# Patient Record
Sex: Female | Born: 1990 | Race: Black or African American | Hispanic: No | Marital: Single | Smoking: Former smoker
Health system: Southern US, Community
[De-identification: ages and names within clinical notes are randomized; demographics above are authoritative.]

## PROBLEM LIST (undated history)

## (undated) DIAGNOSIS — E282 Polycystic ovarian syndrome: Secondary | ICD-10-CM

## (undated) DIAGNOSIS — I10 Essential (primary) hypertension: Secondary | ICD-10-CM

## (undated) DIAGNOSIS — E119 Type 2 diabetes mellitus without complications: Secondary | ICD-10-CM

## (undated) DIAGNOSIS — T7840XA Allergy, unspecified, initial encounter: Secondary | ICD-10-CM

## (undated) HISTORY — PX: CORNEAL TRANSPLANT: SHX108

## (undated) HISTORY — DX: Allergy, unspecified, initial encounter: T78.40XA

## (undated) HISTORY — DX: Essential (primary) hypertension: I10

## (undated) HISTORY — DX: Type 2 diabetes mellitus without complications: E11.9

## (undated) HISTORY — DX: Polycystic ovarian syndrome: E28.2

---

## 2017-05-12 DIAGNOSIS — Z947 Corneal transplant status: Secondary | ICD-10-CM | POA: Diagnosis not present

## 2017-06-08 DIAGNOSIS — E119 Type 2 diabetes mellitus without complications: Secondary | ICD-10-CM | POA: Diagnosis not present

## 2017-06-16 DIAGNOSIS — R1011 Right upper quadrant pain: Secondary | ICD-10-CM | POA: Diagnosis not present

## 2017-06-16 DIAGNOSIS — I10 Essential (primary) hypertension: Secondary | ICD-10-CM | POA: Diagnosis not present

## 2017-06-16 DIAGNOSIS — E119 Type 2 diabetes mellitus without complications: Secondary | ICD-10-CM | POA: Diagnosis not present

## 2017-06-16 DIAGNOSIS — E282 Polycystic ovarian syndrome: Secondary | ICD-10-CM | POA: Diagnosis not present

## 2017-06-27 DIAGNOSIS — K5900 Constipation, unspecified: Secondary | ICD-10-CM | POA: Diagnosis not present

## 2017-06-27 DIAGNOSIS — R1011 Right upper quadrant pain: Secondary | ICD-10-CM | POA: Diagnosis not present

## 2017-07-27 DIAGNOSIS — Z23 Encounter for immunization: Secondary | ICD-10-CM | POA: Diagnosis not present

## 2017-07-28 DIAGNOSIS — N921 Excessive and frequent menstruation with irregular cycle: Secondary | ICD-10-CM | POA: Diagnosis not present

## 2017-08-04 DIAGNOSIS — N840 Polyp of corpus uteri: Secondary | ICD-10-CM | POA: Diagnosis not present

## 2017-08-04 DIAGNOSIS — Z3202 Encounter for pregnancy test, result negative: Secondary | ICD-10-CM | POA: Diagnosis not present

## 2017-08-04 DIAGNOSIS — N939 Abnormal uterine and vaginal bleeding, unspecified: Secondary | ICD-10-CM | POA: Diagnosis not present

## 2017-08-15 DIAGNOSIS — Z947 Corneal transplant status: Secondary | ICD-10-CM | POA: Diagnosis not present

## 2017-08-19 DIAGNOSIS — Z30430 Encounter for insertion of intrauterine contraceptive device: Secondary | ICD-10-CM | POA: Diagnosis not present

## 2017-08-19 DIAGNOSIS — Z3202 Encounter for pregnancy test, result negative: Secondary | ICD-10-CM | POA: Diagnosis not present

## 2017-11-10 DIAGNOSIS — Z947 Corneal transplant status: Secondary | ICD-10-CM | POA: Diagnosis not present

## 2017-12-21 DIAGNOSIS — H4720 Unspecified optic atrophy: Secondary | ICD-10-CM | POA: Diagnosis not present

## 2017-12-21 DIAGNOSIS — Z0100 Encounter for examination of eyes and vision without abnormal findings: Secondary | ICD-10-CM | POA: Diagnosis not present

## 2017-12-21 DIAGNOSIS — E119 Type 2 diabetes mellitus without complications: Secondary | ICD-10-CM | POA: Diagnosis not present

## 2017-12-21 DIAGNOSIS — Z947 Corneal transplant status: Secondary | ICD-10-CM | POA: Diagnosis not present

## 2018-03-02 DIAGNOSIS — Z947 Corneal transplant status: Secondary | ICD-10-CM | POA: Diagnosis not present

## 2018-03-02 DIAGNOSIS — H25013 Cortical age-related cataract, bilateral: Secondary | ICD-10-CM | POA: Diagnosis not present

## 2018-04-12 DIAGNOSIS — H1789 Other corneal scars and opacities: Secondary | ICD-10-CM | POA: Diagnosis not present

## 2018-04-12 DIAGNOSIS — Z947 Corneal transplant status: Secondary | ICD-10-CM | POA: Diagnosis not present

## 2018-04-12 DIAGNOSIS — H04211 Epiphora due to excess lacrimation, right lacrimal gland: Secondary | ICD-10-CM | POA: Diagnosis not present

## 2018-04-12 DIAGNOSIS — H40051 Ocular hypertension, right eye: Secondary | ICD-10-CM | POA: Diagnosis not present

## 2018-04-25 DIAGNOSIS — H3589 Other specified retinal disorders: Secondary | ICD-10-CM | POA: Diagnosis not present

## 2018-04-25 DIAGNOSIS — H53413 Scotoma involving central area, bilateral: Secondary | ICD-10-CM | POA: Diagnosis not present

## 2018-04-25 DIAGNOSIS — Z7984 Long term (current) use of oral hypoglycemic drugs: Secondary | ICD-10-CM | POA: Diagnosis not present

## 2018-04-25 DIAGNOSIS — H47393 Other disorders of optic disc, bilateral: Secondary | ICD-10-CM | POA: Diagnosis not present

## 2018-04-25 DIAGNOSIS — H4720 Unspecified optic atrophy: Secondary | ICD-10-CM | POA: Diagnosis not present

## 2018-05-05 DIAGNOSIS — H4720 Unspecified optic atrophy: Secondary | ICD-10-CM | POA: Diagnosis not present

## 2018-05-19 DIAGNOSIS — T86840 Corneal transplant rejection: Secondary | ICD-10-CM | POA: Diagnosis not present

## 2018-05-19 DIAGNOSIS — H40051 Ocular hypertension, right eye: Secondary | ICD-10-CM | POA: Diagnosis not present

## 2018-05-19 DIAGNOSIS — Z947 Corneal transplant status: Secondary | ICD-10-CM | POA: Diagnosis not present

## 2018-06-21 DIAGNOSIS — H4720 Unspecified optic atrophy: Secondary | ICD-10-CM | POA: Diagnosis not present

## 2018-06-21 DIAGNOSIS — T86840 Corneal transplant rejection: Secondary | ICD-10-CM | POA: Diagnosis not present

## 2018-06-21 DIAGNOSIS — H1789 Other corneal scars and opacities: Secondary | ICD-10-CM | POA: Diagnosis not present

## 2018-06-21 DIAGNOSIS — H40051 Ocular hypertension, right eye: Secondary | ICD-10-CM | POA: Diagnosis not present

## 2018-07-21 ENCOUNTER — Ambulatory Visit (HOSPITAL_COMMUNITY)
Admission: EM | Admit: 2018-07-21 | Discharge: 2018-07-21 | Disposition: A | Discharge status: Home / Self Care | Payer: BC Managed Care – PPO | Attending: Internal Medicine | Admitting: Internal Medicine

## 2018-07-21 ENCOUNTER — Ambulatory Visit (INDEPENDENT_AMBULATORY_CARE_PROVIDER_SITE_OTHER): Payer: BC Managed Care – PPO

## 2018-07-21 ENCOUNTER — Encounter (HOSPITAL_COMMUNITY): Payer: Self-pay

## 2018-07-21 ENCOUNTER — Other Ambulatory Visit: Payer: Self-pay

## 2018-07-21 DIAGNOSIS — M25511 Pain in right shoulder: Secondary | ICD-10-CM | POA: Diagnosis not present

## 2018-07-21 MED ORDER — NAPROXEN 375 MG PO TABS: 375.0000 mg | ORAL_TABLET | Freq: Two times a day (BID) | ORAL | 0 refills | Status: DC

## 2018-07-21 NOTE — ED Provider Notes (Signed)
[place]    CSN: [id number] Arrival date & time: 07/21/18  1104     History   Chief Complaint Chief Complaint  Patient presents with  . Appointment    11:10  . Shoulder Pain    HPI Tamara Boyer is a 28 y.o. female comes to urgent care with right shoulder pain.  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  Since then the patient has continued to have pain around the right shoulder.  Pain is of moderate severity.  Worsened with palpation or movement around the right shoulder.  She denies any relieving factors.  She is tried Tylenol with no relief.  No swelling, bruising.   HPI  History reviewed. No pertinent past medical history.  There are no active problems to display for this patient.   History reviewed. No pertinent surgical history.  OB History   No obstetric history on file.      Home Medications    Prior to Admission medications   Medication Sig Start Date End Date Taking? Authorizing Provider  naproxen (NAPROSYN) 375 MG tablet Take 1 tablet (375 mg total) by mouth 2 (two) times daily. 07/21/18   Tykera Skates, Myrene Galas, MD    Family History Family History  Problem Relation Age of Onset  . Diabetes Mother   . Cancer Father   . Diabetes Father   . Kidney disease Father   . Hypertension Father     Social History Social History   Tobacco Use  . Smoking status: Current Every Day Smoker    Types: Cigarettes  . Smokeless tobacco: Never Used  Substance Use Topics  . Alcohol use: Yes    Comment: occasionally  . Drug use: Never     Allergies   Patient has no known allergies.   Review of Systems Review of Systems  Constitutional: Negative.   HENT: Negative.   Respiratory: Negative.   Gastrointestinal: Negative.   Genitourinary: Negative for dysuria, frequency and urgency.  Musculoskeletal: Positive for arthralgias and myalgias. Negative for back pain, gait problem, joint swelling,  neck pain and neck stiffness.  Skin: Negative.   Neurological: Negative for dizziness, weakness, numbness and headaches.  Psychiatric/Behavioral: Negative.      Physical Exam Triage Vital Signs ED Triage Vitals  Enc Vitals Group     BP 07/21/18 1141 (!) 159/96     Pulse Rate 07/21/18 1141 65     Resp 07/21/18 1141 16     Temp 07/21/18 1141 97.7 F (36.5 C)     Temp Source 07/21/18 1141 Oral     SpO2 07/21/18 1141 100 %     Weight --      Height --      Head Circumference --      Peak Flow --      Pain Score 07/21/18 1139 6     Pain Loc --      Pain Edu? --      Excl. in [place]? --    No data found.  Updated Vital Signs BP (!) 159/96 (BP Location: Left Arm)   Pulse 65   Temp 97.7 F (36.5 C) (Oral)   Resp 16   SpO2 100%   Visual Acuity Right Eye Distance:   Left Eye Distance:   Bilateral Distance:    Right Eye Near:   Left Eye Near:    Bilateral Near:     Physical Exam Constitutional:  General: She is in acute distress.     Appearance: She is not toxic-appearing.  Neck:     Musculoskeletal: Normal range of motion and neck supple. Muscular tenderness present. No neck rigidity.  Cardiovascular:     Rate and Rhythm: Normal rate and regular rhythm.  Pulmonary:     Effort: Pulmonary effort is normal.     Breath sounds: Normal breath sounds.  Abdominal:     General: Bowel sounds are normal.     Palpations: Abdomen is soft.  Musculoskeletal:     Comments: Full range of motion around the right shoulder with pain.  Power is 5/5 in the upper extremity.  No sensory deficit.  Lymphadenopathy:     Cervical: No cervical adenopathy.  Skin:    General: Skin is warm.     Capillary Refill: Capillary refill takes less than 2 seconds.     Coloration: Skin is not jaundiced.     Findings: No bruising or lesion.  Neurological:     General: No focal deficit present.     Mental Status: She is alert and oriented to person, place, and time.      UC Treatments / Results   Labs (all labs ordered are listed, but only abnormal results are displayed) Labs Reviewed - No data to display  EKG   Radiology Dg Shoulder Right  Result Date: 07/21/2018 CLINICAL DATA:  Injury to right shoulder.  Right shoulder pain. EXAM: RIGHT SHOULDER - 2+ VIEW COMPARISON:  None. FINDINGS: Right shoulder is located without a fracture. Normal appearance of the right clavicle and right AC joint. Visualized right ribs are intact. IMPRESSION: Negative. Electronically Signed   By: Richarda OverlieAdam  Henn M.D.   On: 07/21/2018 12:30    Procedures Procedures (including critical care time)  Medications Ordered in UC Medications - No data to display  Initial Impression / Assessment and Plan / UC Course  I have reviewed the triage vital signs and the nursing notes.  Pertinent labs & imaging results that were available during my care of the patient were reviewed by me and considered in my medical decision making (see chart for details).     1.  Right shoulder pain: X-ray of the right shoulder is negative for acute fracture Naproxen twice daily for 5 days Gentle range of motion Continue icing the right shoulder If pain worsens patient can return to urgent care to be reevaluated Final Clinical Impressions(s) / UC Diagnoses   Final diagnoses:  Acute pain of right shoulder   Discharge Instructions   None    ED Prescriptions    Medication Sig Dispense Auth. Provider   naproxen (NAPROSYN) 375 MG tablet Take 1 tablet (375 mg total) by mouth 2 (two) times daily. 20 tablet Edmundo Tedesco, Britta MccreedyPhilip O, MD     Controlled Substance Prescriptions [place] Controlled Substance Registry consulted? No   Merrilee JanskyLamptey, Jernard Reiber O, MD 07/21/18 1315

## 2018-07-21 NOTE — ED Triage Notes (Signed)
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 Patient reportsthte pain has not gotten any better in the past few days, arm is painful with movement.

## 2018-08-18 DIAGNOSIS — H1789 Other corneal scars and opacities: Secondary | ICD-10-CM | POA: Diagnosis not present

## 2018-08-18 DIAGNOSIS — H40051 Ocular hypertension, right eye: Secondary | ICD-10-CM | POA: Diagnosis not present

## 2018-08-18 DIAGNOSIS — T86840 Corneal transplant rejection: Secondary | ICD-10-CM | POA: Diagnosis not present

## 2018-08-31 ENCOUNTER — Encounter: Payer: Self-pay | Admitting: Family Medicine

## 2018-08-31 ENCOUNTER — Ambulatory Visit (INDEPENDENT_AMBULATORY_CARE_PROVIDER_SITE_OTHER): Payer: BC Managed Care – PPO | Admitting: Family Medicine

## 2018-08-31 ENCOUNTER — Other Ambulatory Visit: Payer: Self-pay

## 2018-08-31 VITALS — BP 150/100 | HR 70 | Temp 97.9°F | Ht 63.0 in | Wt 253.2 lb

## 2018-08-31 DIAGNOSIS — Z2821 Immunization not carried out because of patient refusal: Secondary | ICD-10-CM

## 2018-08-31 DIAGNOSIS — E119 Type 2 diabetes mellitus without complications: Secondary | ICD-10-CM | POA: Diagnosis not present

## 2018-08-31 DIAGNOSIS — I10 Essential (primary) hypertension: Secondary | ICD-10-CM | POA: Diagnosis not present

## 2018-08-31 DIAGNOSIS — Z0000 Encounter for general adult medical examination without abnormal findings: Secondary | ICD-10-CM

## 2018-08-31 LAB — CBC
HCT: 38.4 % (ref 36.0–46.0)
Hemoglobin: 12.2 g/dL (ref 12.0–15.0)
MCHC: 31.9 g/dL (ref 30.0–36.0)
MCV: 74.1 fl — ABNORMAL LOW (ref 78.0–100.0)
Platelets: 267 10*3/uL (ref 150.0–400.0)
RBC: 5.19 Mil/uL — ABNORMAL HIGH (ref 3.87–5.11)
RDW: 15.9 % — ABNORMAL HIGH (ref 11.5–15.5)
WBC: 5.1 10*3/uL (ref 4.0–10.5)

## 2018-08-31 LAB — BASIC METABOLIC PANEL
BUN: 14 mg/dL (ref 6–23)
CO2: 25 mEq/L (ref 19–32)
Calcium: 9 mg/dL (ref 8.4–10.5)
Chloride: 104 mEq/L (ref 96–112)
Creatinine, Ser: 0.75 mg/dL (ref 0.40–1.20)
GFR: 109.37 mL/min (ref >60.00)
Glucose, Bld: 97 mg/dL (ref 70–99)
Potassium: 3.9 mEq/L (ref 3.5–5.1)
Sodium: 138 mEq/L (ref 135–145)

## 2018-08-31 LAB — LIPID PANEL
Cholesterol: 175 mg/dL (ref 0–200)
HDL: 42 mg/dL (ref >39.00)
LDL Cholesterol: 122 mg/dL — ABNORMAL HIGH (ref 0–99)
NonHDL: 132.71
Total CHOL/HDL Ratio: 4
Triglycerides: 54 mg/dL (ref 0.0–149.0)
VLDL: 10.8 mg/dL (ref 0.0–40.0)

## 2018-08-31 LAB — MICROALBUMIN / CREATININE URINE RATIO
Creatinine,U: 129.9 mg/dL
Microalb Creat Ratio: 1 mg/g (ref 0.0–30.0)
Microalb, Ur: 1.3 mg/dL (ref 0.0–1.9)

## 2018-08-31 LAB — AST: AST: 15 U/L (ref 0–37)

## 2018-08-31 LAB — HEMOGLOBIN A1C: Hgb A1c MFr Bld: 6.6 % — ABNORMAL HIGH (ref 4.6–6.5)

## 2018-08-31 LAB — VITAMIN D 25 HYDROXY (VIT D DEFICIENCY, FRACTURES): VITD: <7 ng/mL — ABNORMAL LOW (ref 30.00–100.00)

## 2018-08-31 LAB — ALT: ALT: 15 U/L (ref 0–35)

## 2018-08-31 MED ORDER — TRIAMTERENE-HCTZ 75-50 MG PO TABS: 1.0000 | ORAL_TABLET | Freq: Every day | ORAL | 3 refills | Status: DC

## 2018-08-31 MED ORDER — METFORMIN HCL ER 750 MG PO TB24: 750.0000 mg | ORAL_TABLET | Freq: Two times a day (BID) | ORAL | 3 refills | Status: DC

## 2018-08-31 NOTE — Patient Instructions (Signed)
Health Maintenance, Female Adopting a healthy lifestyle and getting preventive care are important in promoting health and wellness. Ask your health care provider about:  The right schedule for you to have regular tests and exams.  Things you can do on your own to prevent diseases and keep yourself healthy. What should I know about diet, weight, and exercise? Eat a healthy diet   Eat a diet that includes plenty of vegetables, fruits, low-fat dairy products, and lean protein.  Do not eat a lot of foods that are high in solid fats, added sugars, or sodium. Maintain a healthy weight Body mass index (BMI) is used to identify weight problems. It estimates body fat based on height and weight. Your health care provider can help determine your BMI and help you achieve or maintain a healthy weight. Get regular exercise Get regular exercise. This is one of the most important things you can do for your health. Most adults should:  Exercise for at least 150 minutes each week. The exercise should increase your heart rate and make you sweat (moderate-intensity exercise).  Do strengthening exercises at least twice a week. This is in addition to the moderate-intensity exercise.  Spend less time sitting. Even light physical activity can be beneficial. Watch cholesterol and blood lipids Have your blood tested for lipids and cholesterol at 30 years of age, then have this test every 5 years. Have your cholesterol levels checked more often if:  Your lipid or cholesterol levels are high.  You are older than 30 years of age.  You are at high risk for heart disease. What should I know about cancer screening? Depending on your health history and family history, you may need to have cancer screening at various ages. This may include screening for:  Breast cancer.  Cervical cancer.  Colorectal cancer.  Skin cancer.  Lung cancer. What should I know about heart disease, diabetes, and high blood  pressure? Blood pressure and heart disease  High blood pressure causes heart disease and increases the risk of stroke. This is more likely to develop in people who have high blood pressure readings, are of African descent, or are overweight.  Have your blood pressure checked: ? Every 3-5 years if you are 18-39 years of age. ? Every year if you are 40 years old or older. Diabetes Have regular diabetes screenings. This checks your fasting blood sugar level. Have the screening done:  Once every three years after age 40 if you are at a normal weight and have a low risk for diabetes.  More often and at a younger age if you are overweight or have a high risk for diabetes. What should I know about preventing infection? Hepatitis B If you have a higher risk for hepatitis B, you should be screened for this virus. Talk with your health care provider to find out if you are at risk for hepatitis B infection. Hepatitis C Testing is recommended for:  Everyone born from 1945 through 1965.  Anyone with known risk factors for hepatitis C. Sexually transmitted infections (STIs)  Get screened for STIs, including gonorrhea and chlamydia, if: ? You are sexually active and are younger than 30 years of age. ? You are older than 30 years of age and your health care provider tells you that you are at risk for this type of infection. ? Your sexual activity has changed since you were last screened, and you are at increased risk for chlamydia or gonorrhea. Ask your health care provider if   you are at risk.  Ask your health care provider about whether you are at high risk for HIV. Your health care provider may recommend a prescription medicine to help prevent HIV infection. If you choose to take medicine to prevent HIV, you should first get tested for HIV. You should then be tested every 3 months for as long as you are taking the medicine. Pregnancy  If you are about to stop having your period (premenopausal) and  you may become pregnant, seek counseling before you get pregnant.  Take 400 to 800 micrograms (mcg) of folic acid every day if you become pregnant.  Ask for birth control (contraception) if you want to prevent pregnancy. Osteoporosis and menopause Osteoporosis is a disease in which the bones lose minerals and strength with aging. This can result in bone fractures. If you are 65 years old or older, or if you are at risk for osteoporosis and fractures, ask your health care provider if you should:  Be screened for bone loss.  Take a calcium or vitamin D supplement to lower your risk of fractures.  Be given hormone replacement therapy (HRT) to treat symptoms of menopause. Follow these instructions at home: Lifestyle  Do not use any products that contain nicotine or tobacco, such as cigarettes, e-cigarettes, and chewing tobacco. If you need help quitting, ask your health care provider.  Do not use street drugs.  Do not share needles.  Ask your health care provider for help if you need support or information about quitting drugs. Alcohol use  Do not drink alcohol if: ? Your health care provider tells you not to drink. ? You are pregnant, may be pregnant, or are planning to become pregnant.  If you drink alcohol: ? Limit how much you use to 0-1 drink a day. ? Limit intake if you are breastfeeding.  Be aware of how much alcohol is in your drink. In the [place] one drink equals one 12 oz bottle of beer (355 mL), one 5 oz glass of wine (148 mL), or one 1 oz glass of hard liquor (44 mL). General instructions  Schedule regular health, dental, and eye exams.  Stay current with your vaccines.  Tell your health care provider if: ? You often feel depressed. ? You have ever been abused or do not feel safe at home. Summary  Adopting a healthy lifestyle and getting preventive care are important in promoting health and wellness.  Follow your health care provider's instructions about healthy  diet, exercising, and getting tested or screened for diseases.  Follow your health care provider's instructions on monitoring your cholesterol and blood pressure. This information is not intended to replace advice given to you by your health care provider. Make sure you discuss any questions you have with your health care provider. Document Released: 07/06/2010 Document Revised: 12/14/2017 Document Reviewed: 12/14/2017 Elsevier Patient Education  2020 Elsevier Inc.  

## 2018-08-31 NOTE — Progress Notes (Signed)
Tamara Boyer is a 28 y.o. female  Chief Complaint  Patient presents with  . Establish Care    est care/ CPE- fasting / denies tdap and flu/ has pap tomorrow    . Medication Refill    maxide and metformin    HPI: Tamara Estellebony Mase is a 28 y.o. female here as a new patient for her annual CPE, fasting labs. She has GYN appt tomorrow for PAP. She declines flu and Tdap vaccines. She has HTN, DM. She is out of BP med [place]. She ran out of metformin in 07/2018. She requests refills of her maxide and metformin. She has not had labs in 1 year or more.   Last PAP: appt tomorrow  Specialists: ophthalmologist (Dr. Dagoberto LigasStoneburner and Dr. Gentry Rochimothy Martin)  Past Medical History:  Diagnosis Date  . Allergy   . Diabetes mellitus without complication (HCC)   . Hypertension   . PCOS (polycystic ovarian syndrome)     Past Surgical History:  Procedure Laterality Date  . CORNEAL TRANSPLANT      Social History   Socioeconomic History  . Marital status: Single    Spouse name: Not on file  . Number of children: Not on file  . Years of education: Not on file  . Highest education level: Not on file  Occupational History  . Not on file  Social Needs  . Financial resource strain: Not on file  . Food insecurity    Worry: Not on file    Inability: Not on file  . Transportation needs    Medical: Not on file    Non-medical: Not on file  Tobacco Use  . Smoking status: Current Every Day Smoker    Types: Cigarettes  . Smokeless tobacco: Never Used  Substance and Sexual Activity  . Alcohol use: Yes    Comment: occasionally  . Drug use: Never  . Sexual activity: Not on file  Lifestyle  . Physical activity    Days per week: Not on file    Minutes per session: Not on file  . Stress: Not on file  Relationships  . Social Musicianconnections    Talks on phone: Not on file    Gets together: Not on file    Attends religious service: Not on file   Active member of club or organization: Not on file    Attends meetings of clubs or organizations: Not on file    Relationship status: Not on file  . Intimate partner violence    Fear of current or ex partner: Not on file    Emotionally abused: Not on file    Physically abused: Not on file    Forced sexual activity: Not on file  Other Topics Concern  . Not on file  Social History Narrative  . Not on file    Family History  Problem Relation Age of Onset  . Diabetes Mother   . Cancer Father   . Diabetes Father   . Kidney disease Father   . Hypertension Father       There is no immunization history on file for this patient.  Outpatient Encounter Medications as of 08/31/2018  Medication Sig  . fexofenadine (ALLEGRA) 60 MG tablet Take 60 mg by mouth 2 (two) times daily.  . metFORMIN (GLUCOPHAGE-XR) 750 MG 24 hr tablet Take by mouth.  . prednisoLONE acetate (PRED FORTE) 1 % ophthalmic suspension 1 drop. TID OD BID MWF and once daily  the other days OS  . triamterene-hydrochlorothiazide (MAXZIDE) 75-50 MG tablet   . [DISCONTINUED] naproxen (NAPROSYN) 375 MG tablet Take 1 tablet (375 mg total) by mouth 2 (two) times daily.   No facility-administered encounter medications on file as of 08/31/2018.      ROS: Gen: no fever, chills  Skin: no rash, itching ENT: no ear pain, ear drainage, nasal congestion, rhinorrhea, sinus pressure, sore throat Resp: no cough, wheeze,SOB CV: no CP, palpitations, LE edema,  GI: no heartburn, n/v/d/c, abd pain GU: no dysuria, urgency, frequency, hematuria MSK: no joint pain, myalgias, back pain Neuro: no dizziness, headache, weakness, vertigo Psych: no depression, anxiety, insomnia   No Known Allergies  BP (!) 150/100   Pulse 70   Temp 97.9 F (36.6 C) (Oral)   Ht 5\' 3"  (1.6 m)   Wt 253 lb 3.2 oz (114.9 kg)   SpO2 97%   BMI 44.85 kg/m   BP Readings from Last 3 Encounters:  08/31/18 (!) 150/100  07/21/18 (!) 159/96     Physical Exam   Constitutional: She is oriented to person, place, and time. She appears well-developed and well-nourished. No distress.  HENT:  Head: Normocephalic and atraumatic.  Right Ear: Tympanic membrane and ear canal normal.  Left Ear: Tympanic membrane and ear canal normal.  Nose: Nose normal.  Mouth/Throat: Oropharynx is clear and moist and mucous membranes are normal.  Eyes: Pupils are equal, round, and reactive to light. Conjunctivae are normal.  Neck: Neck supple. No thyromegaly present.  Cardiovascular: Normal rate, regular rhythm, normal heart sounds and intact distal pulses.  No murmur heard. Pulmonary/Chest: Effort normal and breath sounds normal. No respiratory distress. She has no wheezes. She has no rhonchi.  Abdominal: Soft. Bowel sounds are normal. She exhibits no distension and no mass. There is no abdominal tenderness.  Musculoskeletal:        General: No edema.  Lymphadenopathy:    She has no cervical adenopathy.  Neurological: She is alert and oriented to person, place, and time. She exhibits normal muscle tone. Coordination normal.  Skin: Skin is warm and dry.  Psychiatric: She has a normal mood and affect. Her behavior is normal.     A/P:  1. Annual physical exam - PAP scheduled for tomorrow - declines Tdap and flu vaccines - discussed importance of regular CV exercise, healthy diet, adequate sleep - UTD on dental and vision exams - ALT - AST - Basic metabolic panel - CBC - VITAMIN D 25 Hydroxy (Vit-D Deficiency, Fractures) - Lipid panel - next CPE in 1 year  2. Influenza vaccination declined by patient  3. Tetanus, diphtheria, and acellular pertussis (Tdap) vaccination declined  4. Diabetes mellitus without complication (HCC) - Lipid panel - Microalbumin / creatinine urine ratio - Hemoglobin A1c Refill: - metFORMIN (GLUCOPHAGE-XR) 750 MG 24 hr tablet; Take 1 tablet (750 mg total) by mouth 2 (two) times daily.  Dispense: 180 tablet; Refill: 3 - UTD on eye  exam - last seen 08/2018 - f/u in 3 mo or sooner PRN  5. Essential hypertension - BP elevated, pt has been out of med x 3-4 wks Refill: - triamterene-hydrochlorothiazide (MAXZIDE) 75-50 MG tablet; Take 1 tablet by mouth daily.  Dispense: 90 tablet; Refill: 3 - f/u in 3 mo or sooner PRN

## 2018-09-01 ENCOUNTER — Other Ambulatory Visit: Payer: Self-pay | Admitting: Family Medicine

## 2018-09-01 DIAGNOSIS — Z01419 Encounter for gynecological examination (general) (routine) without abnormal findings: Secondary | ICD-10-CM | POA: Diagnosis not present

## 2018-09-01 DIAGNOSIS — E559 Vitamin D deficiency, unspecified: Secondary | ICD-10-CM

## 2018-09-01 MED ORDER — VITAMIN D (ERGOCALCIFEROL) 1.25 MG (50000 UNIT) PO CAPS: 50000.0000 [IU] | ORAL_CAPSULE | ORAL | 4 refills | Status: DC

## 2018-10-31 DIAGNOSIS — H3589 Other specified retinal disorders: Secondary | ICD-10-CM | POA: Diagnosis not present

## 2018-10-31 DIAGNOSIS — H4720 Unspecified optic atrophy: Secondary | ICD-10-CM | POA: Diagnosis not present

## 2018-10-31 DIAGNOSIS — H53413 Scotoma involving central area, bilateral: Secondary | ICD-10-CM | POA: Diagnosis not present

## 2019-01-25 ENCOUNTER — Telehealth (INDEPENDENT_AMBULATORY_CARE_PROVIDER_SITE_OTHER): Payer: BC Managed Care – PPO | Admitting: Family Medicine

## 2019-01-25 ENCOUNTER — Encounter: Payer: Self-pay | Admitting: Family Medicine

## 2019-01-25 VITALS — Ht 63.0 in

## 2019-01-25 DIAGNOSIS — M5442 Lumbago with sciatica, left side: Secondary | ICD-10-CM | POA: Diagnosis not present

## 2019-01-25 MED ORDER — CYCLOBENZAPRINE HCL 5 MG PO TABS: 5.0000 mg | ORAL_TABLET | Freq: Every evening | ORAL | 0 refills | Status: DC | PRN

## 2019-01-25 MED ORDER — IBUPROFEN 600 MG PO TABS: 600.0000 mg | ORAL_TABLET | Freq: Three times a day (TID) | ORAL | 0 refills | Status: AC | PRN

## 2019-01-25 NOTE — Progress Notes (Signed)
Virtual Visit via Video Note  I connected with Tamara Boyer on 01/25/19 at  1:00 PM EST by a video enabled telemedicine application and verified that I am speaking with the correct person using two identifiers. Location patient: home Location provider: work  Persons participating in the virtual visit: patient, provider  I discussed the limitations of evaluation and management by telemedicine and the availability of in person appointments. The patient expressed understanding and agreed to proceed.  Chief Complaint  Patient presents with  . Back Pain    Pt c/o lt side lower back pain, x 67month but radiating down toward lt hip and lt leg.  Pt has not taken anything for the pain. Pt will give temp and weight vitals during visit     HPI: Tamara Boyer is a 29 y.o. female who complains of Lt low back pain radiating down toward Lt hip and leg. At times, she reports tingling in her Lt leg. Symptoms x 1 mo.  She denies LE weakness. No falls  No specific injury or trauma. Pt tried doing some stretching exercises - this have not been helpful they was they have in the past. She has not taken any OTC meds for pain. Pt works at [employer] and is standing for long periods of time.   Past Medical History:  Diagnosis Date  . Allergy   . Diabetes mellitus without complication ([place])   . Hypertension   . PCOS (polycystic ovarian syndrome)     Past Surgical History:  Procedure Laterality Date  . CORNEAL TRANSPLANT      Family History  Problem Relation Age of Onset  . Diabetes Mother   . Cancer Father   . Diabetes Father   . Kidney disease Father   . Hypertension Father     Social History   Tobacco Use  . Smoking status: Former Smoker    Types: Cigarettes    Start date: 01/2018  . Smokeless tobacco: Never Used  Substance Use Topics  . Alcohol use: Yes    Comment: occasionally  . Drug use: Never     Current Outpatient Medications:  .  fexofenadine (ALLEGRA) 60 MG tablet, Take  60 mg by mouth 2 (two) times daily., Disp: , Rfl:  .  metFORMIN (GLUCOPHAGE-XR) 750 MG 24 hr tablet, Take 1 tablet (750 mg total) by mouth 2 (two) times daily., Disp: 180 tablet, Rfl: 3 .  prednisoLONE acetate (PRED FORTE) 1 % ophthalmic suspension, 1 drop. TID OD BID MWF and once daily the other days OS, Disp: , Rfl:  .  spironolactone (ALDACTONE) 50 MG tablet, Take 50 mg by mouth daily., Disp: , Rfl:  .  triamterene-hydrochlorothiazide (MAXZIDE) 75-50 MG tablet, Take 1 tablet by mouth daily., Disp: 90 tablet, Rfl: 3 .  Vitamin D, Ergocalciferol, (DRISDOL) 1.25 MG (50000 UT) CAPS capsule, Take 1 capsule (50,000 Units total) by mouth every 7 (seven) days., Disp: 5 capsule, Rfl: 4  No Known Allergies    ROS: See pertinent positives and negatives per HPI.   EXAM:  VITALS per patient if applicable: Ht 5\' 3"  (1.6 m)   LMP 01/22/2019   BMI 44.85 kg/m   GENERAL: alert, oriented, appears well and in no acute distress  NECK: normal movements of the head and neck  LUNGS: on inspection no signs of respiratory distress, breathing rate appears normal, no obvious gross SOB, gasping or wheezing, no conversational dyspnea  CV: no obvious cyanosis  MS: moves all visible extremities without noticeable abnormality  PSYCH/NEURO:  pleasant and cooperative, no obvious depression or anxiety, speech and thought processing grossly intact   ASSESSMENT AND PLAN:  1. Acute left-sided low back pain with left-sided sciatica - heating pad BID, exercises daily (included in AVS) Rx: - ibuprofen (ADVIL) 600 MG tablet; Take 1 tablet (600 mg total) by mouth every 8 (eight) hours as needed.  Dispense: 30 tablet; Refill: 0 - pt to take BID w/ food x 5-7 days - cyclobenzaprine (FLEXERIL) 5 MG tablet; Take 1 tablet (5 mg total) by mouth at bedtime as needed for muscle spasms.  Dispense: 15 tablet; Refill: 0 - pt to take qHS x 3-4 nights then PRN - f/u if symptoms worsen or in 2 wks if no/minimal  improvement Discussed plan and reviewed medications with patient, including risks, benefits, and potential side effects. Pt expressed understand. All questions answered.     I discussed the assessment and treatment plan with the patient. The patient was provided an opportunity to ask questions and all were answered. The patient agreed with the plan and demonstrated an understanding of the instructions.   The patient was advised to call back or seek an in-person evaluation if the symptoms worsen or if the condition fails to improve as anticipated.   Luana Shu, DO

## 2019-01-25 NOTE — Patient Instructions (Addendum)
Health Maintenance Due  Topic Date Due  . HIV Screening  02/04/2003  . TETANUS/TDAP  02/04/2007  . PAP SMEAR-Modifier  02/03/2009  . INFLUENZA VACCINE  08/05/2018    Depression screen PHQ 2/9 08/31/2018  Decreased Interest 0  Down, Depressed, Hopeless 0  PHQ - 2 Score 0   Use heating pad 2x/day, 10-15 min on the off Daily stretching exercises after heating pad Take ibuprofen 600mg  1 tab twice per day with food x 5-7 days Take flexeril 5mg  1 tab at bedtime x 3-4 nights then as needed - may make you sleepy   Sciatica Rehab Ask your health care provider which exercises are safe for you. Do exercises exactly as told by your health care provider and adjust them as directed. It is normal to feel mild stretching, pulling, tightness, or discomfort as you do these exercises. Stop right away if you feel sudden pain or your pain gets worse. Do not begin these exercises until told by your health care provider. Stretching and range-of-motion exercises These exercises warm up your muscles and joints and improve the movement and flexibility of your hips and back. These exercises also help to relieve pain, numbness, and tingling. Sciatic nerve glide 1. Sit in a chair with your head facing down toward your chest. Place your hands behind your back. Let your shoulders slump forward. 2. Slowly straighten one of your legs while you tilt your head back as if you are looking toward the ceiling. Only straighten your leg as far as you can without making your symptoms worse. 3. Hold this position for __________ seconds. 4. Slowly return to the starting position. 5. Repeat with your other leg. Repeat __________ times. Complete this exercise __________ times a day. Knee to chest with hip adduction and internal rotation  1. Lie on your back on a firm surface with both legs straight. 2. Bend one of your knees and move it up toward your chest until you feel a gentle stretch in your lower back and buttock. Then,  move your knee toward the shoulder that is on the opposite side from your leg. This is hip adduction and internal rotation. ? Hold your leg in this position by holding on to the front of your knee. 3. Hold this position for __________ seconds. 4. Slowly return to the starting position. 5. Repeat with your other leg. Repeat __________ times. Complete this exercise __________ times a day. Prone extension on elbows  1. Lie on your abdomen on a firm surface. A bed may be too soft for this exercise. 2. Prop yourself up on your elbows. 3. Use your arms to help lift your chest up until you feel a gentle stretch in your abdomen and your lower back. ? This will place some of your body weight on your elbows. If this is uncomfortable, try stacking pillows under your chest. ? Your hips should stay down, against the surface that you are lying on. Keep your hip and back muscles relaxed. 4. Hold this position for __________ seconds. 5. Slowly relax your upper body and return to the starting position. Repeat __________ times. Complete this exercise __________ times a day. Strengthening exercises These exercises build strength and endurance in your back. Endurance is the ability to use your muscles for a long time, even after they get tired. Pelvic tilt This exercise strengthens the muscles that lie deep in the abdomen. 1. Lie on your back on a firm surface. Bend your knees and keep your feet flat on the  floor. 2. Tense your abdominal muscles. Tip your pelvis up toward the ceiling and flatten your lower back into the floor. ? To help with this exercise, you may place a small towel under your lower back and try to push your back into the towel. 3. Hold this position for __________ seconds. 4. Let your muscles relax completely before you repeat this exercise. Repeat __________ times. Complete this exercise __________ times a day. Alternating arm and leg raises  1. Get on your hands and knees on a firm  surface. If you are on a hard floor, you may want to use padding, such as an exercise mat, to cushion your knees. 2. Line up your arms and legs. Your hands should be directly below your shoulders, and your knees should be directly below your hips. 3. Lift your left leg behind you. At the same time, raise your right arm and straighten it in front of you. ? Do not lift your leg higher than your hip. ? Do not lift your arm higher than your shoulder. ? Keep your abdominal and back muscles tight. ? Keep your hips facing the ground. ? Do not arch your back. ? Keep your balance carefully, and do not hold your breath. 4. Hold this position for __________ seconds. 5. Slowly return to the starting position. 6. Repeat with your right leg and your left arm. Repeat __________ times. Complete this exercise __________ times a day. Posture and body mechanics Good posture and healthy body mechanics can help to relieve stress in your body's tissues and joints. Body mechanics refers to the movements and positions of your body while you do your daily activities. Posture is part of body mechanics. Good posture means:  Your spine is in its natural S-curve position (neutral).  Your shoulders are pulled back slightly.  Your head is not tipped forward. Follow these guidelines to improve your posture and body mechanics in your everyday activities. Standing   When standing, keep your spine neutral and your feet about hip width apart. Keep a slight bend in your knees. Your ears, shoulders, and hips should line up.  When you do a task in which you stand in one place for a long time, place one foot up on a stable object that is 2-4 inches (5-10 cm) high, such as a footstool. This helps keep your spine neutral. Sitting   When sitting, keep your spine neutral and keep your feet flat on the floor. Use a footrest, if necessary, and keep your thighs parallel to the floor. Avoid rounding your shoulders, and avoid tilting  your head forward.  When working at a desk or a computer, keep your desk at a height where your hands are slightly lower than your elbows. Slide your chair under your desk so you are close enough to maintain good posture.  When working at a computer, place your monitor at a height where you are looking straight ahead and you do not have to tilt your head forward or downward to look at the screen. Resting  When lying down and resting, avoid positions that are most painful for you.  If you have pain with activities such as sitting, bending, stooping, or squatting, lie in a position in which your body does not bend very much. For example, avoid curling up on your side with your arms and knees near your chest (fetal position).  If you have pain with activities such as standing for a long time or reaching with your arms, lie with  your spine in a neutral position and bend your knees slightly. Try the following positions: ? Lying on your side with a pillow between your knees. ? Lying on your back with a pillow under your knees. Lifting   When lifting objects, keep your feet at least shoulder width apart and tighten your abdominal muscles.  Bend your knees and hips and keep your spine neutral. It is important to lift using the strength of your legs, not your back. Do not lock your knees straight out.  Always ask for help to lift heavy or awkward objects. This information is not intended to replace advice given to you by your health care provider. Make sure you discuss any questions you have with your health care provider. Document Revised: 04/14/2018 Document Reviewed: 01/12/2018 Elsevier Patient Education  [place].

## 2019-03-15 DIAGNOSIS — Z947 Corneal transplant status: Secondary | ICD-10-CM | POA: Diagnosis not present

## 2019-03-15 DIAGNOSIS — T868401 Corneal transplant rejection, right eye: Secondary | ICD-10-CM | POA: Diagnosis not present

## 2019-03-15 DIAGNOSIS — H4720 Unspecified optic atrophy: Secondary | ICD-10-CM | POA: Diagnosis not present

## 2019-03-15 DIAGNOSIS — H40051 Ocular hypertension, right eye: Secondary | ICD-10-CM | POA: Diagnosis not present

## 2019-03-21 ENCOUNTER — Encounter: Payer: Self-pay | Admitting: Family Medicine

## 2019-03-22 ENCOUNTER — Other Ambulatory Visit: Payer: Self-pay

## 2019-03-23 ENCOUNTER — Encounter: Payer: Self-pay | Admitting: Family Medicine

## 2019-03-23 ENCOUNTER — Ambulatory Visit (INDEPENDENT_AMBULATORY_CARE_PROVIDER_SITE_OTHER): Payer: BC Managed Care – PPO | Admitting: Family Medicine

## 2019-03-23 VITALS — BP 162/100 | HR 98 | Temp 97.2°F | Ht 63.0 in | Wt 252.0 lb

## 2019-03-23 DIAGNOSIS — I10 Essential (primary) hypertension: Secondary | ICD-10-CM | POA: Diagnosis not present

## 2019-03-23 MED ORDER — LOSARTAN POTASSIUM 50 MG PO TABS: 50.0000 mg | ORAL_TABLET | Freq: Every day | ORAL | 3 refills | Status: AC

## 2019-03-23 NOTE — Patient Instructions (Signed)
Check BP 3-4x/wk x 2 wks  Follow-up in 2 wks (virtual visit)

## 2019-03-23 NOTE — Progress Notes (Signed)
Chief Complaint  Patient presents with  . Hypertension    Pt here for Bp check.   HPI: Tamara Boyer is a 29 y.o. female here for HTN follow-up. She sent a MyChart message stating home BP has been running high with reading of 164/105. Pt is taking maxzide 75-50mg  1 tab daily. She does not take aldactone.  She denies HA, vision changes, CP, SOB, palpitations, dizziness. She has been tried on lisinopril in the past but it was not effective.   BP Readings from Last 3 Encounters:  03/23/19 (!) 162/100  08/31/18 (!) 150/100  07/21/18 (!) 159/96   Lab Results  Component Value Date   CREATININE 0.75 08/31/2018   BUN 14 08/31/2018   NA 138 08/31/2018   K 3.9 08/31/2018   CL 104 08/31/2018   CO2 25 08/31/2018     Past Medical History:  Diagnosis Date  . Allergy   . Diabetes mellitus without complication ([facility])   . Hypertension   . PCOS (polycystic ovarian syndrome)     Past Surgical History:  Procedure Laterality Date  . CORNEAL TRANSPLANT      Social History   Socioeconomic History  . Marital status: Single    Spouse name: Not on file  . Number of children: Not on file  . Years of education: Not on file  . Highest education level: Not on file  Occupational History  . Not on file  Tobacco Use  . Smoking status: Former Smoker    Types: Cigarettes    Start date: 01/2018  . Smokeless tobacco: Never Used  Substance and Sexual Activity  . Alcohol use: Yes    Comment: occasionally  . Drug use: Never  . Sexual activity: Not on file  Other Topics Concern  . Not on file  Social History Narrative  . Not on file   Social Determinants of Health   Financial Resource Strain:   . Difficulty of Paying Living Expenses:   Food Insecurity:   . Worried About Charity fundraiser in the Last Year:   . Arboriculturist in the Last Year:   Transportation Needs:   . Film/video editor (Medical):   Marland Kitchen Lack of Transportation (Non-Medical):   Physical Activity:   . Days  of Exercise per Week:   . Minutes of Exercise per Session:   Stress:   . Feeling of Stress :   Social Connections:   . Frequency of Communication with Friends and Family:   . Frequency of Social Gatherings with Friends and Family:   . Attends Religious Services:   . Active Member of Clubs or Organizations:   . Attends Archivist Meetings:   Marland Kitchen Marital Status:   Intimate Partner Violence:   . Fear of Current or Ex-Partner:   . Emotionally Abused:   Marland Kitchen Physically Abused:   . Sexually Abused:     Family History  Problem Relation Age of Onset  . Diabetes Mother   . Cancer Father   . Diabetes Father   . Kidney disease Father   . Hypertension Father       There is no immunization history on file for this patient.  Outpatient Encounter Medications as of 03/23/2019  Medication Sig  . cyclobenzaprine (FLEXERIL) 5 MG tablet Take 1 tablet (5 mg total) by mouth at bedtime as needed for muscle spasms.  . fexofenadine (ALLEGRA) 60 MG tablet Take 60 mg by mouth 2 (two) times daily.  Marland Kitchen ibuprofen (ADVIL)  600 MG tablet Take 1 tablet (600 mg total) by mouth every 8 (eight) hours as needed.  . Levonorgestrel (SKYLA) 13.5 MG IUD Skyla 14 mcg/24 hrs (3 yrs) 13.5 mg intrauterine device  Take by intrauterine route.  . metFORMIN (GLUCOPHAGE-XR) 750 MG 24 hr tablet Take 1 tablet (750 mg total) by mouth 2 (two) times daily.  . prednisoLONE acetate (PRED FORTE) 1 % ophthalmic suspension 1 drop. TID OD BID MWF and once daily the other days OS  . spironolactone (ALDACTONE) 50 MG tablet Take 50 mg by mouth daily.  Marland Kitchen triamterene-hydrochlorothiazide (MAXZIDE) 75-50 MG tablet Take 1 tablet by mouth daily.  . Vitamin D, Ergocalciferol, (DRISDOL) 1.25 MG (50000 UT) CAPS capsule Take 1 capsule (50,000 Units total) by mouth every 7 (seven) days.   No facility-administered encounter medications on file as of 03/23/2019.     ROS: Pertinent positives and negatives noted in HPI. Remainder of ROS  non-contributory    No Known Allergies  BP (!) 162/100 (BP Location: Left Arm, Patient Position: Sitting, Cuff Size: Normal)   Pulse 98   Temp (!) 97.2 F (36.2 C) (Temporal)   Ht 5\' 3"  (1.6 m)   Wt 252 lb (114.3 kg)   LMP 01/21/2019   SpO2 98%   BMI 44.64 kg/m   BP Readings from Last 3 Encounters:  03/23/19 (!) 162/100  08/31/18 (!) 150/100  07/21/18 (!) 159/96     Physical Exam  Constitutional: She is oriented to person, place, and time. She appears well-developed and well-nourished. No distress.  Cardiovascular: Normal rate, regular rhythm and normal heart sounds.  Pulmonary/Chest: Effort normal and breath sounds normal. No respiratory distress.  Musculoskeletal:        General: No edema.  Neurological: She is alert and oriented to person, place, and time.  Psychiatric: She has a normal mood and affect. Her behavior is normal.     A/P:  1. Essential hypertension - not at goal, home and in-office readings are elevated  - cont maxzide Rx: - losartan (COZAAR) 50 MG tablet; Take 1 tablet (50 mg total) by mouth daily.  Dispense: 90 tablet; Refill: 3 - low sodium diet, regular walking - check BP at home 3-4x.wk x 2 wks then f/u in 2 wks w/ VV Discussed plan and reviewed medications with patient, including risks, benefits, and potential side effects. Pt expressed understand. All questions answered.   This visit occurred during the SARS-CoV-2 public health emergency.  Safety protocols were in place, including screening questions prior to the visit, additional usage of staff PPE, and extensive cleaning of exam room while observing appropriate contact time as indicated for disinfecting solutions.

## 2019-04-19 ENCOUNTER — Telehealth (INDEPENDENT_AMBULATORY_CARE_PROVIDER_SITE_OTHER): Payer: BC Managed Care – PPO | Admitting: Family Medicine

## 2019-04-19 ENCOUNTER — Encounter: Payer: Self-pay | Admitting: Family Medicine

## 2019-04-19 VITALS — BP 132/89 | HR 94 | Ht 63.0 in

## 2019-04-19 DIAGNOSIS — I10 Essential (primary) hypertension: Secondary | ICD-10-CM | POA: Diagnosis not present

## 2019-04-19 NOTE — Patient Instructions (Signed)
Health Maintenance Due  Topic Date Due  . HIV Screening  Never done  . TETANUS/TDAP  Never done    Depression screen PHQ 2/9 08/31/2018  Decreased Interest 0  Down, Depressed, Hopeless 0  PHQ - 2 Score 0

## 2019-04-19 NOTE — Progress Notes (Signed)
Virtual Visit via Video Note  I connected with Tamara Boyer on 04/19/19 at  1:30 PM EDT by a video enabled telemedicine application and verified that I am speaking with the correct person using two identifiers. Location patient: home Location provider: work  Persons participating in the virtual visit: patient, provider  I discussed the limitations of evaluation and management by telemedicine and the availability of in person appointments. The patient expressed understanding and agreed to proceed.  Chief Complaint  Patient presents with  . Hypertension    Follow up     HPI: Tamara Boyer is a 29 y.o. female seen for f/u on HTN. She is currently taking losartan 50mg  daily and maxzide 75-50mg  daily. Home BP have been at goal <140 / < 90.  Pt denies HA, dizziness, vision changes, CP, SOB, LE edema. Denies med side effects.   Past Medical History:  Diagnosis Date  . Allergy   . Diabetes mellitus without complication ([facility])   . Hypertension   . PCOS (polycystic ovarian syndrome)     Past Surgical History:  Procedure Laterality Date  . CORNEAL TRANSPLANT      Family History  Problem Relation Age of Onset  . Diabetes Mother   . Cancer Father   . Diabetes Father   . Kidney disease Father   . Hypertension Father     Social History   Tobacco Use  . Smoking status: Former Smoker    Types: Cigarettes    Start date: 01/2018  . Smokeless tobacco: Never Used  Substance Use Topics  . Alcohol use: Yes    Comment: occasionally  . Drug use: Never     Current Outpatient Medications:  .  cyclobenzaprine (FLEXERIL) 5 MG tablet, Take 1 tablet (5 mg total) by mouth at bedtime as needed for muscle spasms., Disp: 15 tablet, Rfl: 0 .  fexofenadine (ALLEGRA) 60 MG tablet, Take 60 mg by mouth 2 (two) times daily., Disp: , Rfl:  .  ibuprofen (ADVIL) 600 MG tablet, Take 1 tablet (600 mg total) by mouth every 8 (eight) hours as needed., Disp: 30 tablet, Rfl: 0 .  Levonorgestrel (SKYLA)  13.5 MG IUD, Skyla 14 mcg/24 hrs (3 yrs) 13.5 mg intrauterine device  Take by intrauterine route., Disp: , Rfl:  .  losartan (COZAAR) 50 MG tablet, Take 1 tablet (50 mg total) by mouth daily., Disp: 90 tablet, Rfl: 3 .  metFORMIN (GLUCOPHAGE-XR) 750 MG 24 hr tablet, Take 1 tablet (750 mg total) by mouth 2 (two) times daily., Disp: 180 tablet, Rfl: 3 .  prednisoLONE acetate (PRED FORTE) 1 % ophthalmic suspension, 1 drop. TID OD BID MWF and once daily the other days OS, Disp: , Rfl:  .  triamterene-hydrochlorothiazide (MAXZIDE) 75-50 MG tablet, Take 1 tablet by mouth daily., Disp: 90 tablet, Rfl: 3 .  Vitamin D, Ergocalciferol, (DRISDOL) 1.25 MG (50000 UT) CAPS capsule, Take 1 capsule (50,000 Units total) by mouth every 7 (seven) days., Disp: 5 capsule, Rfl: 4  No Known Allergies    ROS: See pertinent positives and negatives per HPI.   EXAM:  VITALS per patient if applicable: BP 132/89 (BP Location: Left Arm, Patient Position: Sitting, Cuff Size: Normal)   Pulse 94   Ht 5\' 3"  (1.6 m)   BMI 44.64 kg/m    GENERAL: alert, oriented, appears well and in no acute distress  HEENT: atraumatic, conjunctiva clear, no obvious abnormalities on inspection of external nose and ears  NECK: normal movements of the head and neck  LUNGS: on inspection no signs of respiratory distress, breathing rate appears normal, no obvious gross SOB, gasping or wheezing, no conversational dyspnea  CV: no obvious cyanosis  PSYCH/NEURO: pleasant and cooperative, no obvious depression or anxiety, speech and thought processing grossly intact   ASSESSMENT AND PLAN:  1. Essential hypertension - controlled, at goal - cont current meds - cont low sodium diet and regular exercise - check BP at home 1-2x/wk and keep log - f/u in 2-3 mo or sooner PRN    I discussed the assessment and treatment plan with the patient. The patient was provided an opportunity to ask questions and all were answered. The patient agreed  with the plan and demonstrated an understanding of the instructions.   The patient was advised to call back or seek an in-person evaluation if the symptoms worsen or if the condition fails to improve as anticipated.   Letta Median, DO

## 2019-05-02 DIAGNOSIS — H53413 Scotoma involving central area, bilateral: Secondary | ICD-10-CM | POA: Diagnosis not present

## 2019-05-02 DIAGNOSIS — H3589 Other specified retinal disorders: Secondary | ICD-10-CM | POA: Diagnosis not present

## 2019-05-02 DIAGNOSIS — H18603 Keratoconus, unspecified, bilateral: Secondary | ICD-10-CM | POA: Diagnosis not present

## 2019-05-02 DIAGNOSIS — H4720 Unspecified optic atrophy: Secondary | ICD-10-CM | POA: Diagnosis not present

## 2019-06-13 DIAGNOSIS — H4720 Unspecified optic atrophy: Secondary | ICD-10-CM | POA: Diagnosis not present

## 2019-06-13 DIAGNOSIS — H4050X2 Glaucoma secondary to other eye disorders, unspecified eye, moderate stage: Secondary | ICD-10-CM | POA: Diagnosis not present

## 2019-06-13 DIAGNOSIS — E119 Type 2 diabetes mellitus without complications: Secondary | ICD-10-CM | POA: Diagnosis not present

## 2019-06-13 DIAGNOSIS — H52203 Unspecified astigmatism, bilateral: Secondary | ICD-10-CM | POA: Diagnosis not present

## 2019-06-13 LAB — HM DIABETES EYE EXAM

## 2019-08-30 ENCOUNTER — Encounter: Payer: Self-pay | Admitting: Family Medicine

## 2019-08-30 ENCOUNTER — Ambulatory Visit (INDEPENDENT_AMBULATORY_CARE_PROVIDER_SITE_OTHER): Payer: 59 | Admitting: Family Medicine

## 2019-08-30 ENCOUNTER — Other Ambulatory Visit: Payer: Self-pay

## 2019-08-30 VITALS — BP 150/98 | HR 68 | Temp 97.1°F | Ht 63.0 in | Wt 248.0 lb

## 2019-08-30 DIAGNOSIS — M542 Cervicalgia: Secondary | ICD-10-CM | POA: Diagnosis not present

## 2019-08-30 DIAGNOSIS — M25512 Pain in left shoulder: Secondary | ICD-10-CM

## 2019-08-30 DIAGNOSIS — I10 Essential (primary) hypertension: Secondary | ICD-10-CM | POA: Diagnosis not present

## 2019-08-30 MED ORDER — CYCLOBENZAPRINE HCL 5 MG PO TABS: 5.0000 mg | ORAL_TABLET | Freq: Every evening | ORAL | 0 refills | Status: DC | PRN

## 2019-08-30 MED ORDER — NABUMETONE 500 MG PO TABS: 500.0000 mg | ORAL_TABLET | Freq: Two times a day (BID) | ORAL | 0 refills | Status: AC

## 2019-08-30 NOTE — Progress Notes (Signed)
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 Pain    neck and LT shoulder pain x 1 month.      HPI: Tamara Boyer is a 29 y.o. female who complains of 1 mo h/o Lt neck and shoulder pain. No injury or trauma. Pt associates start of symptoms with getting her 2nd covid vaccine. She describes pain as "throbbing" at times but it always "there".  Pain with ROM.  No swelling. No decreased grip strength.  She has trouble falling asleep d/t pain. She is able to lay on her Lt side.  Pt took ibuprofen 600mg  2x/day x 2-3 weeks without improvement.  Pt is Rt handed but she states she uses Lt hand and arm to lift things.   Past Medical History:  Diagnosis Date  . Allergy   . Diabetes mellitus without complication ([facility])   . Hypertension   . PCOS (polycystic ovarian syndrome)     Past Surgical History:  Procedure Laterality Date  . CORNEAL TRANSPLANT      Social History   Socioeconomic History  . Marital status: Single    Spouse name: Not on file  . Number of children: Not on file  . Years of education: Not on file  . Highest education level: Not on file  Occupational History  . Not on file  Tobacco Use  . Smoking status: Former Smoker    Types: Cigarettes    Start date: 01/2018  . Smokeless tobacco: Never Used  Vaping Use  . Vaping Use: Never used  Substance and Sexual Activity  . Alcohol use: Yes    Comment: occasionally  . Drug use: Never  . Sexual activity: Not on file  Other Topics Concern  . Not on file  Social History Narrative  . Not on file   Social Determinants of Health   Financial Resource Strain:   . Difficulty of Paying Living Expenses: Not on file  Food Insecurity:   . Worried About 02/2018 in the Last Year: Not on file  . Ran Out of Food in the Last Year: Not on file  Transportation Needs:   . Lack of Transportation (Medical): Not on file  . Lack of Transportation (Non-Medical): Not on file  Physical  Activity:   . Days of Exercise per Week: Not on file  . Minutes of Exercise per Session: Not on file  Stress:   . Feeling of Stress : Not on file  Social Connections:   . Frequency of Communication with Friends and Family: Not on file  . Frequency of Social Gatherings with Friends and Family: Not on file  . Attends Religious Services: Not on file  . Active Member of Clubs or Organizations: Not on file  . Attends Programme researcher, broadcasting/film/video Meetings: Not on file  . Marital Status: Not on file  Intimate Partner Violence:   . Fear of Current or Ex-Partner: Not on file  . Emotionally Abused: Not on file  . Physically Abused: Not on file  . Sexually Abused: Not on file    Family History  Problem Relation Age of Onset  . Diabetes Mother   . Cancer Father   . Diabetes Father   . Kidney disease Father   . Hypertension Father       There is no immunization history on file for this patient.  Outpatient Encounter Medications as of 08/30/2019  Medication Sig  . cyclobenzaprine (FLEXERIL) 5 MG tablet Take  1 tablet (5 mg total) by mouth at bedtime as needed for muscle spasms.  . fexofenadine (ALLEGRA) 60 MG tablet Take 60 mg by mouth 2 (two) times daily.  Marland Kitchen ibuprofen (ADVIL) 600 MG tablet Take 1 tablet (600 mg total) by mouth every 8 (eight) hours as needed.  . Levonorgestrel (SKYLA) 13.5 MG IUD Skyla 14 mcg/24 hrs (3 yrs) 13.5 mg intrauterine device  Take by intrauterine route.  Marland Kitchen losartan (COZAAR) 50 MG tablet Take 1 tablet (50 mg total) by mouth daily.  . metFORMIN (GLUCOPHAGE-XR) 750 MG 24 hr tablet Take 1 tablet (750 mg total) by mouth 2 (two) times daily.  . prednisoLONE acetate (PRED FORTE) 1 % ophthalmic suspension 1 drop. TID OD BID MWF and once daily the other days OS  . triamterene-hydrochlorothiazide (MAXZIDE) 75-50 MG tablet Take 1 tablet by mouth daily.  . Vitamin D, Ergocalciferol, (DRISDOL) 1.25 MG (50000 UT) CAPS capsule Take 1 capsule (50,000 Units total) by mouth every 7  (seven) days.  . [DISCONTINUED] cyclobenzaprine (FLEXERIL) 5 MG tablet Take 1 tablet (5 mg total) by mouth at bedtime as needed for muscle spasms.  . nabumetone (RELAFEN) 500 MG tablet Take 1 tablet (500 mg total) by mouth 2 (two) times daily.   No facility-administered encounter medications on file as of 08/30/2019.     ROS: Pertinent positives and negatives noted in HPI. Remainder of ROS non-contributory   No Known Allergies  BP (!) 150/98   Pulse 68   Temp (!) 97.1 F (36.2 C) (Temporal)   Ht 5\' 3"  (1.6 m)   Wt 248 lb (112.5 kg)   SpO2 98%   BMI 43.93 kg/m   BP Readings from Last 3 Encounters:  08/30/19 (!) 150/98  04/19/19 132/89  03/23/19 (!) 162/100     Physical Exam Constitutional:      General: She is not in acute distress.    Appearance: She is obese. She is not ill-appearing.  Musculoskeletal:     Right shoulder: Normal.     Left shoulder: Tenderness present. No swelling, deformity, effusion or bony tenderness. Normal range of motion. Normal strength. Normal pulse.     Cervical back: Normal range of motion. Pain with movement and muscular tenderness present. No spinous process tenderness.     Right lower leg: No edema.     Left lower leg: No edema.  Neurological:     General: No focal deficit present.     Mental Status: She is alert and oriented to person, place, and time.     Sensory: No sensory deficit.     Motor: No weakness.     Coordination: Coordination normal.  Psychiatric:        Mood and Affect: Mood normal.        Behavior: Behavior normal.      A/P:  1. Essential hypertension - uncontrolled, pt out of BP med and just starting taking again 4 days ago  2. Musculoskeletal neck pain 3. Acute pain of left shoulder - heat 2-3x/day following by ROM exercises Rx: - cyclobenzaprine (FLEXERIL) 5 MG tablet; Take 1 tablet (5 mg total) by mouth at bedtime as needed for muscle spasms.  Dispense: 15 tablet; Refill: 0 - nabumetone (RELAFEN) 500 MG  tablet; Take 1 tablet (500 mg total) by mouth 2 (two) times daily.  Dispense: 60 tablet; Refill: 0 - f/u if symptoms worsen or do not improve in 2-3 wks Discussed plan and reviewed medications with patient, including risks, benefits, and potential side effects.  Pt expressed understand. All questions answered.     This visit occurred during the SARS-CoV-2 public health emergency.  Safety protocols were in place, including screening questions prior to the visit, additional usage of staff PPE, and extensive cleaning of exam room while observing appropriate contact time as indicated for disinfecting solutions.

## 2019-08-30 NOTE — Patient Instructions (Addendum)
Heating pad 2-3x/day 15-20 min on the off After heat, do neck and shoulder range of motion exercises Take flexeril 5mg  1 tab at bedtime x 4-5 nights Take relafen 1 tab twice per day with food

## 2019-11-22 ENCOUNTER — Other Ambulatory Visit: Payer: Self-pay | Admitting: Family Medicine

## 2019-11-22 DIAGNOSIS — E119 Type 2 diabetes mellitus without complications: Secondary | ICD-10-CM

## 2019-11-22 DIAGNOSIS — I10 Essential (primary) hypertension: Secondary | ICD-10-CM

## 2019-11-22 NOTE — Telephone Encounter (Signed)
Last OV 08/30/19 Last fill for both meds 08/31/18  #90/3

## 2020-05-05 NOTE — Patient Instructions (Signed)
Health Maintenance Due  Topic Date Due  . Hepatitis C Screening  Never done  . COVID-19 Vaccine (1) Never done  . HIV Screening  Never done  . TETANUS/TDAP  Never done    Depression screen PHQ 2/9 08/31/2018  Decreased Interest 0  Down, Depressed, Hopeless 0  PHQ - 2 Score 0

## 2020-05-07 ENCOUNTER — Ambulatory Visit: Payer: 59 | Admitting: Family Medicine

## 2020-05-07 NOTE — Progress Notes (Signed)
No show

## 2020-07-03 ENCOUNTER — Telehealth (INDEPENDENT_AMBULATORY_CARE_PROVIDER_SITE_OTHER): Payer: 59 | Admitting: Family Medicine

## 2020-07-03 ENCOUNTER — Encounter: Payer: Self-pay | Admitting: Family Medicine

## 2020-07-03 VITALS — Ht 63.0 in | Wt 243.0 lb

## 2020-07-03 DIAGNOSIS — R053 Chronic cough: Secondary | ICD-10-CM

## 2020-07-03 MED ORDER — AZITHROMYCIN 250 MG PO TABS: ORAL_TABLET | ORAL | 0 refills | Status: AC

## 2020-07-03 MED ORDER — HYDROCOD POLST-CPM POLST ER 10-8 MG/5ML PO SUER: 5.0000 mL | Freq: Two times a day (BID) | ORAL | 0 refills | Status: DC | PRN

## 2020-07-03 MED ORDER — BENZONATATE 100 MG PO CAPS: 100.0000 mg | ORAL_CAPSULE | Freq: Three times a day (TID) | ORAL | 0 refills | Status: DC | PRN

## 2020-07-03 NOTE — Patient Instructions (Addendum)
Nasal saline spray 3x/day Mucinex or Mucinex DM 1 tab 2x/day Flonase 2 sprays each nostril daily Take tessalon capsules 1 3x/day as needed Take tussionex cough syrup at bedtime Take antibiotic as directed

## 2020-07-03 NOTE — Progress Notes (Signed)
Virtual Visit via Video Note  I connected with Tamara Boyer on 07/03/20 at  9:00 AM EDT by a video enabled telemedicine application and verified that I am speaking with the correct person using two identifiers. Location patient: home Location provider: work  Persons participating in the virtual visit: patient, provider  I discussed the limitations of evaluation and management by telemedicine and the availability of in person appointments. The patient expressed understanding and agreed to proceed.  Chief Complaint  Patient presents with   Acute Visit    Pt c/o sinus and chest congestion x 1 week. Pt states she has SOB, a productive cough with green and yellow mucus, and feels like she is losing her voice. Pt has tried OTC medications (dayquil, nyquil, and coriciden)with no relief. Pt tested for COVID  yesterday and it was negative.      HPI: Tamara Boyer is a 30 y.o. female patient who complains of 1 week h/o sinus pressure, nasal congestion, runny nose. + sore throat, PND, throat-clearing cough. Cough keeps pt up at night.  No fever. No SOB at rest.  Pt has tried OTC meds like dayquil, nyquil, coricidin w/o significant relief.  Negative covid test   Past Medical History:  Diagnosis Date   Allergy    Diabetes mellitus without complication (HCC)    Hypertension    PCOS (polycystic ovarian syndrome)     Past Surgical History:  Procedure Laterality Date   CORNEAL TRANSPLANT      Family History  Problem Relation Age of Onset   Diabetes Mother    Cancer Father    Diabetes Father    Kidney disease Father    Hypertension Father     Social History   Tobacco Use   Smoking status: Former    Pack years: 0.00    Types: Cigarettes    Start date: 01/2018   Smokeless tobacco: Never  Vaping Use   Vaping Use: Never used  Substance Use Topics   Alcohol use: Yes    Comment: occasionally   Drug use: Never     Current Outpatient Medications:    cyclobenzaprine (FLEXERIL) 5  MG tablet, Take 1 tablet (5 mg total) by mouth at bedtime as needed for muscle spasms., Disp: 15 tablet, Rfl: 0   fexofenadine (ALLEGRA) 60 MG tablet, Take 60 mg by mouth 2 (two) times daily., Disp: , Rfl:    ibuprofen (ADVIL) 600 MG tablet, Take 1 tablet (600 mg total) by mouth every 8 (eight) hours as needed., Disp: 30 tablet, Rfl: 0   Levonorgestrel (SKYLA) 13.5 MG IUD, Skyla 14 mcg/24 hrs (3 yrs) 13.5 mg intrauterine device  Take by intrauterine route., Disp: , Rfl:    losartan (COZAAR) 50 MG tablet, Take 1 tablet (50 mg total) by mouth daily., Disp: 90 tablet, Rfl: 3   metFORMIN (GLUCOPHAGE-XR) 750 MG 24 hr tablet, TAKE 1 TABLET (750 MG TOTAL) BY MOUTH 2 (TWO) TIMES DAILY., Disp: 180 tablet, Rfl: 3   nabumetone (RELAFEN) 500 MG tablet, Take 1 tablet (500 mg total) by mouth 2 (two) times daily., Disp: 60 tablet, Rfl: 0   prednisoLONE acetate (PRED FORTE) 1 % ophthalmic suspension, 1 drop. TID OD BID MWF and once daily the other days OS, Disp: , Rfl:    triamterene-hydrochlorothiazide (MAXZIDE) 75-50 MG tablet, TAKE 1 TABLET BY MOUTH EVERY DAY, Disp: 90 tablet, Rfl: 3   Vitamin D, Ergocalciferol, (DRISDOL) 1.25 MG (50000 UT) CAPS capsule, Take 1 capsule (50,000 Units total) by mouth every 7 (  seven) days., Disp: 5 capsule, Rfl: 4  No Known Allergies    ROS: See pertinent positives and negatives per HPI.   EXAM:  VITALS per patient if applicable: Ht 5\' 3"  (1.6 m)   Wt 243 lb (110.2 kg)   BMI 43.05 kg/m    GENERAL: alert, oriented, in no acute distress  HEENT: atraumatic, conjunctiva clear, no obvious abnormalities on inspection of external nose and ears  NECK: normal movements of the head and neck  LUNGS: on inspection no signs of respiratory distress, breathing rate appears normal, no obvious gross SOB, gasping or wheezing, no conversational dyspnea; persistent coughing  CV: no obvious cyanosis  PSYCH/NEURO: pleasant and cooperative, speech and thought processing grossly  intact   ASSESSMENT AND PLAN:  1. Persistent cough - nasal saline spray, flonase, mucinex BID Rx: - chlorpheniramine-HYDROcodone (TUSSIONEX PENNKINETIC ER) 10-8 MG/5ML SUER; Take 5 mLs by mouth every 12 (twelve) hours as needed for cough.  Dispense: 115 mL; Refill: 0 - benzonatate (TESSALON) 100 MG capsule; Take 1 capsule (100 mg total) by mouth 3 (three) times daily as needed for cough.  Dispense: 30 capsule; Refill: 0 - azithromycin (ZITHROMAX) 250 MG tablet; Take 2 tablets on day 1, then 1 tablet daily on days 2 through 5  Dispense: 6 tablet; Refill: 0 - f/u if symptoms worsen or do not improve in 5-7 days Discussed plan and reviewed medications with patient, including risks, benefits, and potential side effects. Pt expressed understand. All questions answered.       I discussed the assessment and treatment plan with the patient. The patient was provided an opportunity to ask questions and all were answered. The patient agreed with the plan and demonstrated an understanding of the instructions.   The patient was advised to call back or seek an in-person evaluation if the symptoms worsen or if the condition fails to improve as anticipated.   , DO

## 2020-08-01 ENCOUNTER — Ambulatory Visit (INDEPENDENT_AMBULATORY_CARE_PROVIDER_SITE_OTHER): Payer: 59 | Admitting: Family Medicine

## 2020-08-01 ENCOUNTER — Other Ambulatory Visit: Payer: Self-pay

## 2020-08-01 ENCOUNTER — Encounter: Payer: Self-pay | Admitting: Family Medicine

## 2020-08-01 VITALS — BP 188/100 | HR 80 | Temp 97.2°F | Ht 66.0 in | Wt 244.2 lb

## 2020-08-01 DIAGNOSIS — Z0000 Encounter for general adult medical examination without abnormal findings: Secondary | ICD-10-CM | POA: Diagnosis not present

## 2020-08-01 DIAGNOSIS — E559 Vitamin D deficiency, unspecified: Secondary | ICD-10-CM | POA: Diagnosis not present

## 2020-08-01 DIAGNOSIS — I10 Essential (primary) hypertension: Secondary | ICD-10-CM

## 2020-08-01 DIAGNOSIS — E119 Type 2 diabetes mellitus without complications: Secondary | ICD-10-CM

## 2020-08-01 NOTE — Patient Instructions (Signed)
Take medications every day as prescribed Return in 1 week for BP f/u appt and fasting labs

## 2020-08-01 NOTE — Progress Notes (Signed)
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 No breast or pap exam needed.    HPI: Tamara Boyer is a 30 y.o. female patient seen today for annual CPE, labs. She is not fasting for labs.   Last PAP: 03/2020 - normal PAP. Due in 3 years  Diet/Exercise: "horrible" - too much fast food, skips meals; no regular exercise activity Dental: UTD Vision: wears glasses, was due in 06/2020 but was cancelled and pt needs to reschedule  BP is very high today. Pt states she is not very good about taking her BP medication. She can go weeks without taking it.  She has meds at home and does not need refills.   Pt is working at [employer] 3-11pm/12am. Graduated in May and is looking for job.  Past Medical History:  Diagnosis Date   Allergy    Diabetes mellitus without complication (HCC)    Hypertension    PCOS (polycystic ovarian syndrome)     Past Surgical History:  Procedure Laterality Date   CORNEAL TRANSPLANT      Social History   Socioeconomic History   Marital status: Single    Spouse name: Not on file   Number of children: Not on file   Years of education: Not on file   Highest education level: Not on file  Occupational History   Not on file  Tobacco Use   Smoking status: Former    Types: Cigarettes    Start date: 01/2018   Smokeless tobacco: Never  Vaping Use   Vaping Use: Never used  Substance and Sexual Activity   Alcohol use: Yes    Comment: occasionally   Drug use: Never   Sexual activity: Not Currently  Other Topics Concern   Not on file  Social History Narrative   Not on file   Social Determinants of Health   Financial Resource Strain: Not on file  Food Insecurity: Not on file  Transportation Needs: Not on file  Physical Activity: Not on file  Stress: Not on file  Social Connections: Not on file  Intimate Partner Violence: Not on file    Family History  Problem Relation Age of Onset   Diabetes  Mother    Cancer Father    Diabetes Father    Kidney disease Father    Hypertension Father      Immunization History  Administered Date(s) Administered   Moderna Sars-Covid-2 Vaccination 06/17/2020, 07/15/2020    Outpatient Encounter Medications as of 08/01/2020  Medication Sig   fexofenadine (ALLEGRA) 60 MG tablet Take 60 mg by mouth 2 (two) times daily.   ibuprofen (ADVIL) 600 MG tablet Take 1 tablet (600 mg total) by mouth every 8 (eight) hours as needed.   Levonorgestrel (SKYLA) 13.5 MG IUD Skyla 14 mcg/24 hrs (3 yrs) 13.5 mg intrauterine device  Take by intrauterine route.   losartan (COZAAR) 50 MG tablet Take 1 tablet (50 mg total) by mouth daily.   metFORMIN (GLUCOPHAGE-XR) 750 MG 24 hr tablet TAKE 1 TABLET (750 MG TOTAL) BY MOUTH 2 (TWO) TIMES DAILY.   nabumetone (RELAFEN) 500 MG tablet Take 1 tablet (500 mg total) by mouth 2 (two) times daily.   prednisoLONE acetate (PRED FORTE) 1 % ophthalmic suspension 1 drop. TID OD BID MWF and once daily the other days OS   triamterene-hydrochlorothiazide (MAXZIDE) 75-50 MG tablet TAKE 1 TABLET BY MOUTH EVERY DAY   [DISCONTINUED] benzonatate (TESSALON)  100 MG capsule Take 1 capsule (100 mg total) by mouth 3 (three) times daily as needed for cough.   [DISCONTINUED] chlorpheniramine-HYDROcodone (TUSSIONEX PENNKINETIC ER) 10-8 MG/5ML SUER Take 5 mLs by mouth every 12 (twelve) hours as needed for cough.   albuterol (VENTOLIN HFA) 108 (90 Base) MCG/ACT inhaler Inhale 2 puffs into the lungs every 4 (four) hours as needed.   [DISCONTINUED] cyclobenzaprine (FLEXERIL) 5 MG tablet Take 1 tablet (5 mg total) by mouth at bedtime as needed for muscle spasms. (Patient not taking: Reported on 08/01/2020)   [DISCONTINUED] Vitamin D, Ergocalciferol, (DRISDOL) 1.25 MG (50000 UT) CAPS capsule Take 1 capsule (50,000 Units total) by mouth every 7 (seven) days. (Patient not taking: Reported on 08/01/2020)   No facility-administered encounter medications on file as  of 08/01/2020.     ROS: Gen: no fever, chills  Skin: no rash, itching ENT: no ear pain, ear drainage, nasal congestion, rhinorrhea, sinus pressure, sore throat Eyes: no blurry vision, double vision Resp: no cough, wheeze,SOB Breast: no breast tenderness, no nipple discharge, no breast masses CV: no CP, palpitations, LE edema,  GI: no heartburn, n/v/d/c, abd pain GU: no dysuria, urgency, frequency, hematuria; no vaginal itching, odor, discharge MSK: no joint pain, myalgias, back pain Neuro: no dizziness, headache, weakness, vertigo Psych: no depression, anxiety, insomnia   No Known Allergies  BP (!) 188/100 (BP Location: Left Arm, Patient Position: Sitting, Cuff Size: Large)   Pulse 80   Temp (!) 97.2 F (36.2 C) (Temporal)   Ht 5\' 6"  (1.676 m)   Wt 244 lb 3.2 oz (110.8 kg)   LMP  (LMP Unknown)   SpO2 97%   BMI 39.41 kg/m   Wt Readings from Last 3 Encounters:  08/01/20 244 lb 3.2 oz (110.8 kg)  07/03/20 243 lb (110.2 kg)  08/30/19 248 lb (112.5 kg)   Temp Readings from Last 3 Encounters:  08/01/20 (!) 97.2 F (36.2 C) (Temporal)  08/30/19 (!) 97.1 F (36.2 C) (Temporal)  03/23/19 (!) 97.2 F (36.2 C) (Temporal)   BP Readings from Last 3 Encounters:  08/01/20 (!) 188/100  08/30/19 (!) 150/98  04/19/19 132/89   Pulse Readings from Last 3 Encounters:  08/01/20 80  08/30/19 68  04/19/19 94     Physical Exam Constitutional:      General: She is not in acute distress.    Appearance: She is well-developed. She is obese.  HENT:     Head: Normocephalic and atraumatic.     Right Ear: Tympanic membrane and ear canal normal.     Left Ear: Tympanic membrane and ear canal normal.     Nose: Nose normal.     Mouth/Throat:     Mouth: Mucous membranes are moist.     Pharynx: Oropharynx is clear.  Eyes:     Conjunctiva/sclera: Conjunctivae normal.  Neck:     Thyroid: No thyromegaly.  Cardiovascular:     Rate and Rhythm: Normal rate and regular rhythm.  Pulmonary:      Effort: Pulmonary effort is normal. No respiratory distress.     Breath sounds: Normal breath sounds. No wheezing or rhonchi.  Abdominal:     General: Bowel sounds are normal. There is no distension.     Palpations: Abdomen is soft. There is no mass.     Tenderness: There is no abdominal tenderness.  Musculoskeletal:     Cervical back: Neck supple.     Right lower leg: No edema.     Left lower leg: No  edema.  Lymphadenopathy:     Cervical: No cervical adenopathy.  Skin:    General: Skin is warm and dry.  Neurological:     Mental Status: She is alert and oriented to person, place, and time.     Motor: No abnormal muscle tone.     Coordination: Coordination normal.  Psychiatric:        Behavior: Behavior normal.     A/P:  1. Annual physical exam - discussed importance of regular CV exercise, healthy diet, adequate sleep - UTD on PAP and breast exam - UTD on dental exam, needs to schedule eye exam (was due in 06/2020) - immunizations UTD - pt believes she had Tdap in the past 10 years - CBC; Future - Basic metabolic panel; Future - AST; Future - ALT; Future - next CPE in 1 year  2. Vitamin D deficiency - took Rx Vit D in 2020 but is not taking OTC daily Vit  - VITAMIN D 25 Hydroxy (Vit-D Deficiency, Fractures); Future  3. Essential hypertension - uncontrolled, pt is not taking medications - stressed importance of medication compliance and dietary improvement and need for regular exercise - pt will restart meds and take daily and f/u for OV in 1 week - CBC; Future - Basic metabolic panel; Future  4. Diabetes mellitus without complication (HCC) - pt not taking metformin - restart metformin 750mg  BID - stressed importance of medication compliance and dietary improvement and need for regular exercise - Microalbumin / creatinine urine ratio; Future - Hemoglobin A1c; Future - Lipid panel; Future - lab appt in 1 wk   This visit occurred during the SARS-CoV-2 public  health emergency.  Safety protocols were in place, including screening questions prior to the visit, additional usage of staff PPE, and extensive cleaning of exam room while observing appropriate contact time as indicated for disinfecting solutions.

## 2020-08-08 ENCOUNTER — Ambulatory Visit: Payer: 59 | Admitting: Nurse Practitioner

## 2021-02-03 DIAGNOSIS — Z0000 Encounter for general adult medical examination without abnormal findings: Secondary | ICD-10-CM | POA: Diagnosis not present

## 2021-02-03 DIAGNOSIS — E559 Vitamin D deficiency, unspecified: Secondary | ICD-10-CM | POA: Diagnosis not present

## 2021-02-03 DIAGNOSIS — D519 Vitamin B12 deficiency anemia, unspecified: Secondary | ICD-10-CM | POA: Diagnosis not present

## 2021-02-03 DIAGNOSIS — R509 Fever, unspecified: Secondary | ICD-10-CM | POA: Diagnosis not present

## 2021-02-03 DIAGNOSIS — E119 Type 2 diabetes mellitus without complications: Secondary | ICD-10-CM | POA: Diagnosis not present

## 2021-02-03 DIAGNOSIS — E782 Mixed hyperlipidemia: Secondary | ICD-10-CM | POA: Diagnosis not present

## 2021-02-03 DIAGNOSIS — R058 Other specified cough: Secondary | ICD-10-CM | POA: Diagnosis not present

## 2021-02-03 DIAGNOSIS — J209 Acute bronchitis, unspecified: Secondary | ICD-10-CM | POA: Diagnosis not present

## 2021-03-10 DIAGNOSIS — E119 Type 2 diabetes mellitus without complications: Secondary | ICD-10-CM | POA: Diagnosis not present

## 2021-03-10 DIAGNOSIS — R109 Unspecified abdominal pain: Secondary | ICD-10-CM | POA: Diagnosis not present

## 2021-03-10 DIAGNOSIS — E559 Vitamin D deficiency, unspecified: Secondary | ICD-10-CM | POA: Diagnosis not present

## 2021-03-10 DIAGNOSIS — M549 Dorsalgia, unspecified: Secondary | ICD-10-CM | POA: Diagnosis not present

## 2021-04-22 DIAGNOSIS — Z947 Corneal transplant status: Secondary | ICD-10-CM | POA: Diagnosis not present

## 2021-04-22 DIAGNOSIS — H52203 Unspecified astigmatism, bilateral: Secondary | ICD-10-CM | POA: Diagnosis not present

## 2021-04-22 DIAGNOSIS — E119 Type 2 diabetes mellitus without complications: Secondary | ICD-10-CM | POA: Diagnosis not present

## 2021-04-29 IMAGING — DX RIGHT SHOULDER - 2+ VIEW
4 series · 4 of 4 positions shown · non-contrast
Comparison: None.

CLINICAL DATA: Injury to right shoulder.  Right shoulder pain.

EXAM:
RIGHT SHOULDER - 2+ VIEW

[shoulder ap]
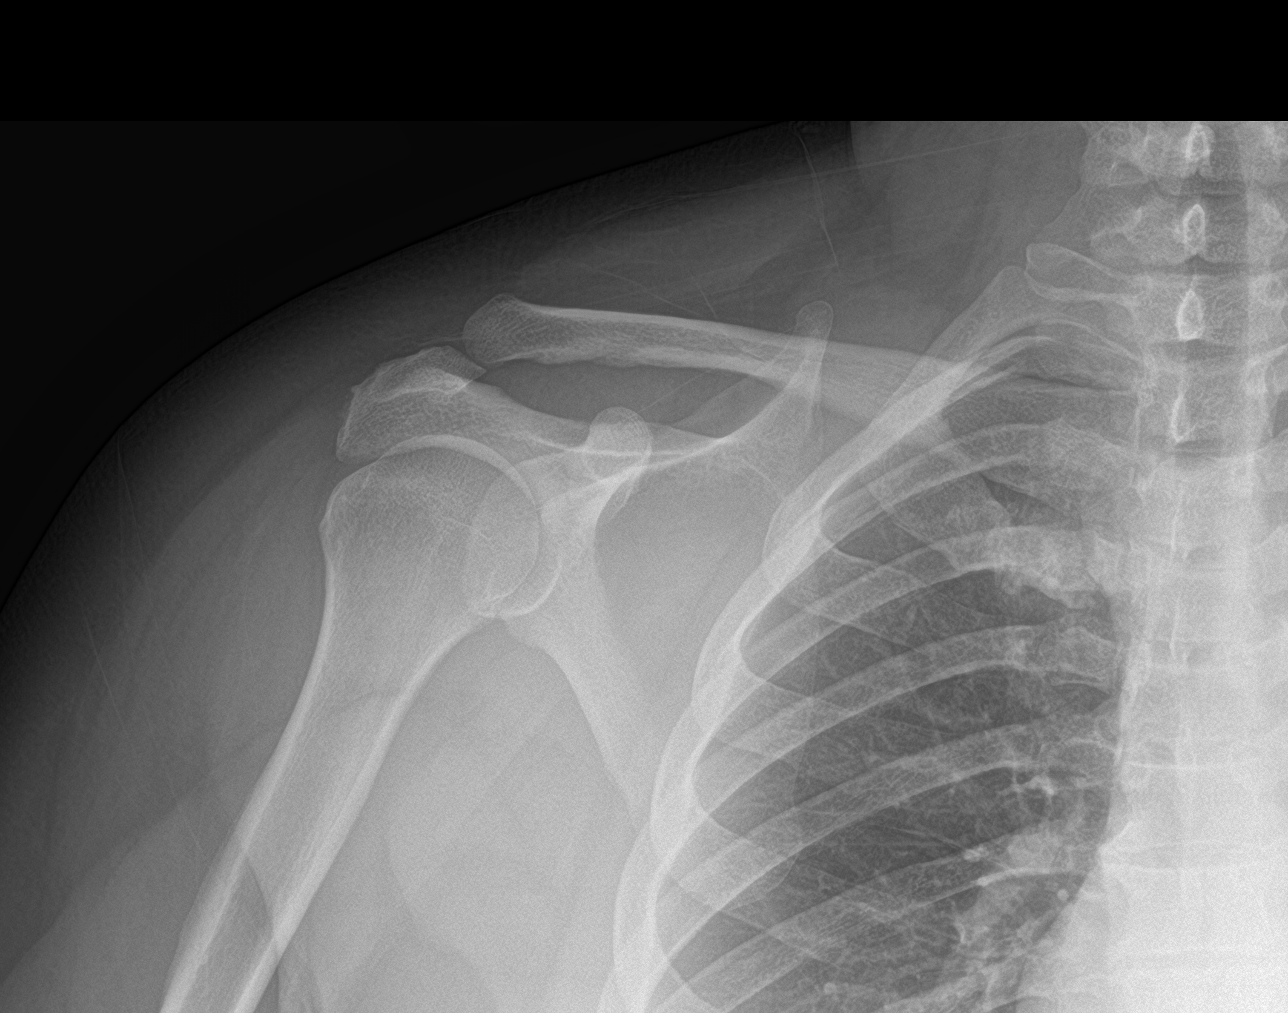

[shoulder grashey]
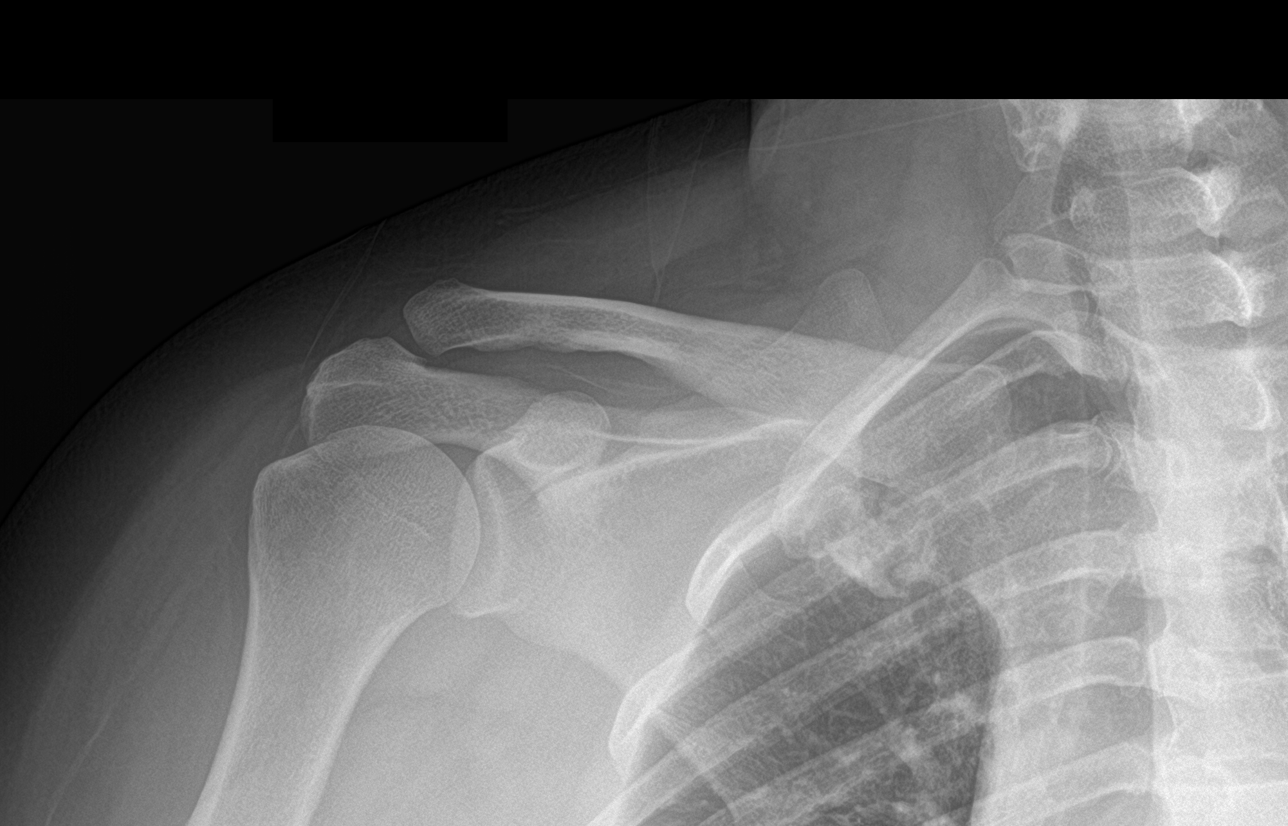

[shoulder y-view]
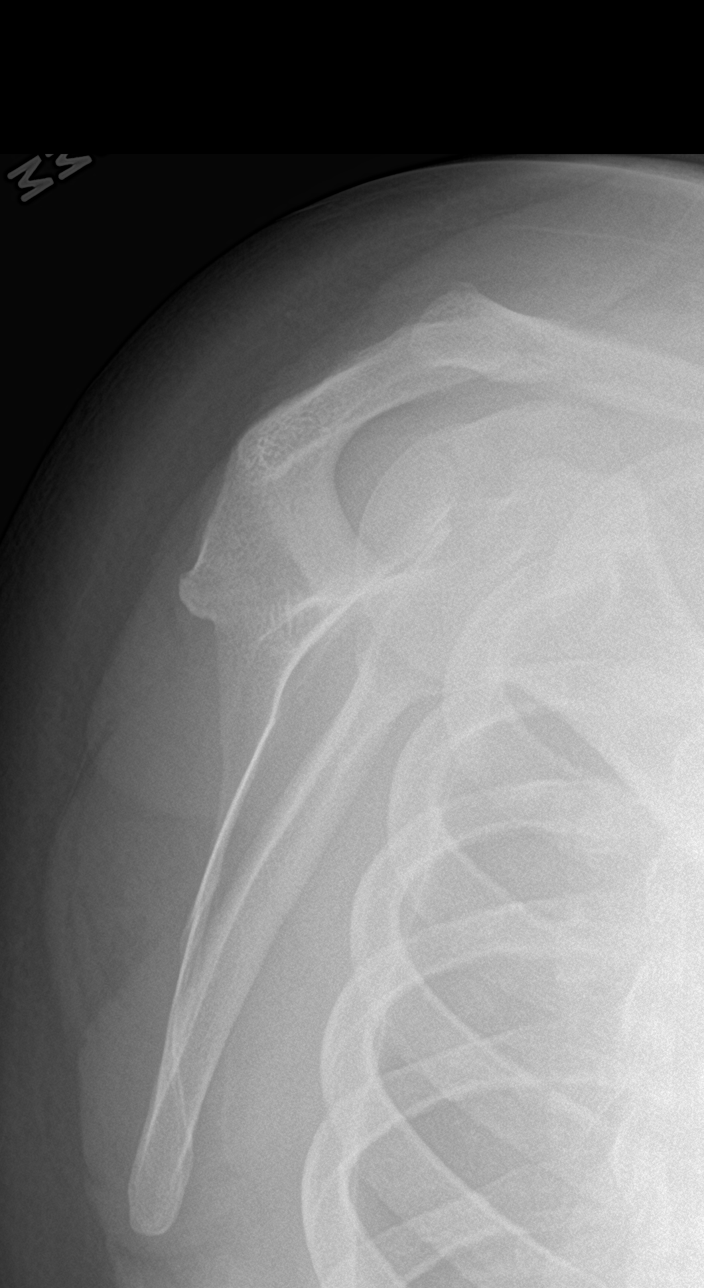

[shoulder axial]
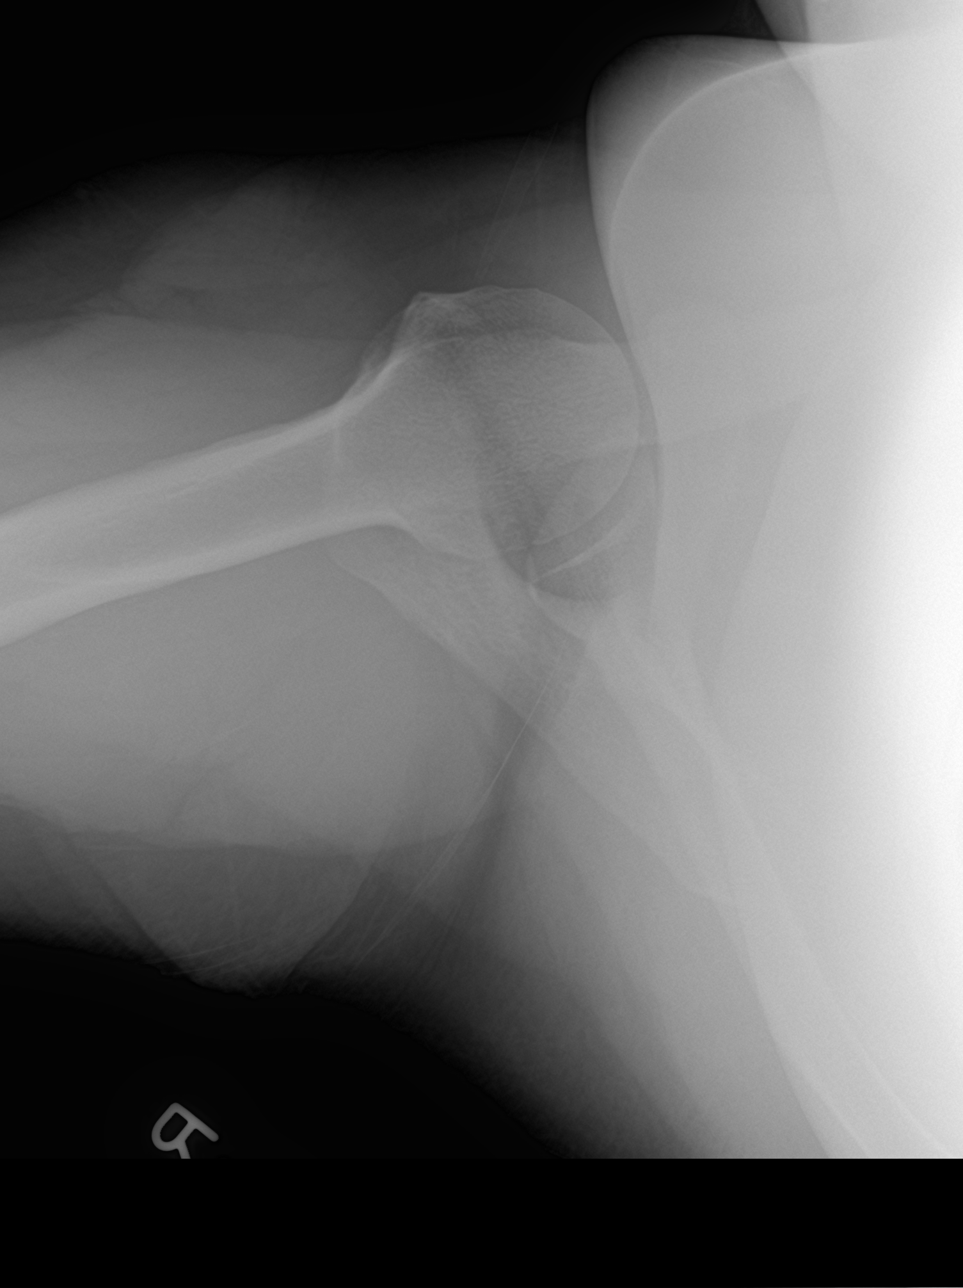

[4 of 4 positions shown; findings below may reference images not displayed]

FINDINGS: Right shoulder is located without a fracture. Normal appearance of
the right clavicle and right AC joint. Visualized right ribs are
intact.
IMPRESSION: Negative.

## 2021-06-20 DIAGNOSIS — M25552 Pain in left hip: Secondary | ICD-10-CM | POA: Diagnosis not present

## 2021-06-20 DIAGNOSIS — N390 Urinary tract infection, site not specified: Secondary | ICD-10-CM | POA: Diagnosis not present

## 2021-06-20 DIAGNOSIS — M5442 Lumbago with sciatica, left side: Secondary | ICD-10-CM | POA: Diagnosis not present

## 2021-06-20 DIAGNOSIS — E119 Type 2 diabetes mellitus without complications: Secondary | ICD-10-CM | POA: Diagnosis not present
